# Patient Record
Sex: Female | Born: 2000 | Race: White | Hispanic: No | Marital: Single
Health system: Southern US, Community
[De-identification: ages and names within clinical notes are randomized; demographics above are authoritative.]

---

## 2021-03-18 ENCOUNTER — Ambulatory Visit (INDEPENDENT_AMBULATORY_CARE_PROVIDER_SITE_OTHER): Payer: BLUE CROSS/BLUE SHIELD | Admitting: Sports Medicine

## 2021-03-18 ENCOUNTER — Ambulatory Visit
Admission: RE | Admit: 2021-03-18 | Discharge: 2021-03-18 | Disposition: A | Discharge status: Home / Self Care | Payer: BLUE CROSS/BLUE SHIELD | Source: Ambulatory Visit | Attending: Sports Medicine | Admitting: Sports Medicine

## 2021-03-18 ENCOUNTER — Other Ambulatory Visit: Payer: Self-pay

## 2021-03-18 VITALS — BP 100/72 | Ht 70.0 in | Wt 150.0 lb

## 2021-03-18 DIAGNOSIS — M25551 Pain in right hip: Secondary | ICD-10-CM

## 2021-03-18 NOTE — Progress Notes (Signed)
   Subjective:    Patient ID: Lori Weeks, female    DOB: 2000/07/15, 20 y.o.   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  She is status post left knee ACL reconstructions x2, both performed in 2021.  She has done well postoperatively with those but her right hip pain began shortly after her second reconstruction.  Pain is deep within the groin.  It is intermittent and present with weightbearing.  She describes popping and catching in the groin as well as occasional locking.  She has been undergoing treatment for this in the athletic training room at [facility] but has not noticed any improvement.  She denies any previous hip surgeries.  Pain does not radiate.  She has remained active despite her pain.  Past medical history reviewed Medications reviewed Allergies reviewed    Review of Systems As above    Objective:   Physical Exam  Well-developed, well-nourished.  No acute distress  Right hip: Smooth painless hip range of motion with a negative logroll.  Positive FADIR notable for both pain and popping.  No tenderness to palpation over the greater trochanter.  No tenderness to palpation along the hip flexors.  Decreased strength secondary to pain.  Neurovascularly intact distally.  Standing x-rays of the right hip including AP and lateral views are unremarkable.  I see no obvious loose body and nothing acute.      Assessment & Plan:   Chronic right hip pain worrisome for labral tear versus loose body  MRI arthrogram specifically to rule out a labral tear or a loose body which may need operative intervention.  She has had almost a year of symptoms that have not improved with several weeks of treatment in the training room at [employer].  Phone follow-up with those results when available and we will delineate a more definitive  treatment plan based on those findings.  This note was dictated using Dragon naturally speaking software and may contain errors in syntax, spelling, or content which have not been identified prior to signing this note.

## 2021-04-05 ENCOUNTER — Ambulatory Visit: Payer: BLUE CROSS/BLUE SHIELD | Admitting: Family Medicine

## 2021-04-05 NOTE — Progress Notes (Deleted)
   Lori Weeks is a 20 y.o. female who presents to [facility] today for the following:  Right shoulder pain Patient is a [employer] officer, historic buildings Previously seen in training room earlier in the week for this problem Larey Seat 2 weeks ago onto her anterior right shoulder and has been having pain and difficulty moving it since then Had some chronic right shoulder pain off and on, however this has been more severe since the injury She has not had any improvement in her range of motion since the injury She has been receiving treatment and [name] athletic training room Also currently being evaluated for chronic right hip pain and has an MR arthrogram scheduled for 11/18 ***  PMH reviewed.  ROS as above. Medications reviewed.  Exam:  There were no vitals taken for this visit. Gen: Well NAD MSK:  Right Shoulder: *** Inspection reveals no obvious deformity, atrophy, or asymmetry b/l. No bruising. No swelling Palpation is normal with no TTP over [facility] joint or bicipital groove b/l. Full ROM in flexion, abduction, internal/external rotation b/l NV intact distally b/l Normal scapular function observed b/l Special Tests:  - Impingement: Neg Hawkins, neers, empty can sign. - Supraspinatous: Negative empty can - Infraspinatous/Teres Minor: 5/5 strength with ER - Subscapularis: 5/5 strength with IR - Biceps tendon: Negative Speeds, Yerrgason's  - Labrum: Negative Obriens, negative clunk, good stability - AC Joint: Negative cross arm - Negative apprehension test - No painful arc and no drop arm sign    Assessment and Plan: 1) No problem-specific Assessment & Plan notes found for this encounter.   Luis Abed, D.O.  PGY-4 [facility]  04/05/2021 8:38 AM

## 2021-04-10 ENCOUNTER — Ambulatory Visit (INDEPENDENT_AMBULATORY_CARE_PROVIDER_SITE_OTHER): Payer: BLUE CROSS/BLUE SHIELD | Admitting: Family Medicine

## 2021-04-10 ENCOUNTER — Ambulatory Visit
Admission: RE | Admit: 2021-04-10 | Discharge: 2021-04-10 | Disposition: A | Discharge status: Home / Self Care | Payer: BLUE CROSS/BLUE SHIELD | Source: Ambulatory Visit | Attending: Family Medicine | Admitting: Family Medicine

## 2021-04-10 VITALS — BP 110/62 | Ht 70.0 in | Wt 150.0 lb

## 2021-04-10 DIAGNOSIS — M25511 Pain in right shoulder: Secondary | ICD-10-CM

## 2021-04-10 NOTE — Progress Notes (Signed)
   Lori Weeks is a 20 y.o. 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 the week for this problem Larey Seat 3 weeks ago while playing volleyball, she dove and jammed her shoulder upwards, and has been having pain and difficulty moving it since then Had some chronic right shoulder pain off and on related to [name] sprain, however this has been more severe since the injury and feels different She has not had any improvement in her range of motion since the injury She has been receiving treatment and [name] athletic training room Also currently being evaluated for chronic right hip pain and has an MR arthrogram scheduled for 11/18  PMH reviewed.  ROS as above. Medications reviewed.  Exam:  BP 110/62   Ht 5\' 10"  (1.778 m)   Wt 150 lb (68 kg)   BMI 21.52 kg/m  Gen: Well NAD MSK:  Right Shoulder:  Inspection reveals no obvious deformity, atrophy, or asymmetry b/l. No bruising. No swelling Palpation is normal with no TTP over [name] joint or bicipital groove b/l. Active and passive ROM limited to 90 degrees in flexion, 95 in abduction, L1 in IR, and 60 in ER on right, full on left NV intact distally b/l Normal scapular function observed b/l Special Tests:  - Impingement: Positive Hawkins - Supraspinatous: Positive empty can - Infraspinatous/Teres Minor: 5/5 strength with ER, produces pain - Subscapularis: 5/5 strength with IR, produces pain - Biceps tendon: Negative Speeds - Labrum: Positive Obriens - Positive apprehension test    Assessment and Plan: 1) Acute pain of right shoulder Concern for labral tear vs. Rotator cuff pathology.  Will obtain XR and if negative, will require a MR arthrogram to assess labrum specifically.  Will follow up after imaging with patient to determine treatment plan.  Will likely discuss with [facility] given her significant  symptoms once results return.   Lori Weeks, D.O.  PGY-4 [facility]  04/10/2021 2:16 PM  Addendum:  I was the preceptor for this visit and available for immediate consultation.  04/12/2021 MD Lori Weeks

## 2021-04-10 NOTE — Assessment & Plan Note (Signed)
Concern for labral tear vs. Rotator cuff pathology.  Will obtain XR and if negative, will require a MR arthrogram to assess labrum specifically.  Will follow up after imaging with patient to determine treatment plan.  Will likely discuss with Dr. [name] given her significant symptoms once results return.

## 2021-04-12 ENCOUNTER — Other Ambulatory Visit: Payer: Self-pay

## 2021-04-12 ENCOUNTER — Ambulatory Visit
Admission: RE | Admit: 2021-04-12 | Discharge: 2021-04-12 | Disposition: A | Discharge status: Home / Self Care | Payer: BLUE CROSS/BLUE SHIELD | Source: Ambulatory Visit | Attending: Sports Medicine | Admitting: Sports Medicine

## 2021-04-12 DIAGNOSIS — M25551 Pain in right hip: Secondary | ICD-10-CM

## 2021-04-12 MED ORDER — IOPAMIDOL (ISOVUE-M 200) INJECTION 41%
15.0000 mL | Freq: Once | INTRAMUSCULAR | Status: AC
  Administered 2021-04-12: 15 mL via INTRA_ARTICULAR

## 2021-04-22 ENCOUNTER — Telehealth: Payer: Self-pay | Admitting: Sports Medicine

## 2021-04-22 NOTE — Telephone Encounter (Signed)
  I spoke with Lori Weeks on the phone today after reviewing MRI arthrogram findings of her right hip.  She does have a partial tear of the right anterior superior labrum.  The rest of her hip is unremarkable.  She has had symptoms for several months.  Therefore, I think she will eventually need to see an orthopedist that specializes in hip arthroscopy to discuss appropriate treatment.  However, she also has an MRI arthrogram of her right shoulder in a couple of weeks so I recommend that we first review that study before deciding on further treatment of her hip.  She is in agreement with that plan.  I will be sure to follow-up with her with MRI arthrogram results of her shoulder when available.

## 2021-05-07 ENCOUNTER — Ambulatory Visit
Admission: RE | Admit: 2021-05-07 | Discharge: 2021-05-07 | Disposition: A | Discharge status: Home / Self Care | Payer: BLUE CROSS/BLUE SHIELD | Source: Ambulatory Visit | Attending: Family Medicine | Admitting: Family Medicine

## 2021-05-07 ENCOUNTER — Other Ambulatory Visit: Payer: Self-pay

## 2021-05-07 DIAGNOSIS — M25511 Pain in right shoulder: Secondary | ICD-10-CM

## 2021-05-07 MED ORDER — IOPAMIDOL (ISOVUE-M 300) INJECTION 61%
10.0000 mL | Freq: Once | INTRAMUSCULAR | Status: AC | PRN
  Administered 2021-05-07: 10 mL via INTRA_ARTICULAR

## 2021-05-08 ENCOUNTER — Telehealth: Payer: Self-pay | Admitting: Family Medicine

## 2021-05-08 NOTE — Telephone Encounter (Signed)
Shoulder arthrogram reviewed, shows posterior labral tear.  Discussed with patient that she will likely need surgical consultation.  Given that she also has a hip labral tear, and prior notes with Dr. Margaretha Sheffield state that he was planning ot see results of shoulder MRA before placing referral, will discuss with him where referral will be placed.  Likely shoulder will be referred to Dr. [name] at [facility].  Told patient that we will call back tomorrow.

## 2021-05-09 ENCOUNTER — Telehealth: Payer: Self-pay | Admitting: Family Medicine

## 2021-05-09 NOTE — Telephone Encounter (Signed)
Called patient after discussing with Dr. Margaretha Sheffield.  Will refer to Dr. [name] for shoulder eval and consider referral for hip pending decision for shoulder surgery.  She also reports she may go home for surgery, but would like to see Dr. [name] first.  Advised to obtain images from [facility].

## 2022-12-03 IMAGING — MR MR SHOULDER*R* W/CM
6 series · 40 of 40 positions shown · IV contrast (agent unspecified)
Comparison: X-ray shoulder 04/10/2021.

CLINICAL DATA: Right shoulder pain with weakness for 2 months.
years ago. Clinical concern for labral tear.

EXAM:
MR ARTHROGRAM OF THE RIGHT SHOULDER
TECHNIQUE: Multiplanar, multisequence MR imaging of the right shoulder was
performed following the administration of intra-articular contrast.
CONTRAST:  See Injection Documentation.

[Series 3: T1 fat-sat · axial · 4.0mm · 0.29mm/px · z∈[-14,+95]mm · 6 of 23 slices shown (1 of 3)]
[im 1/23]
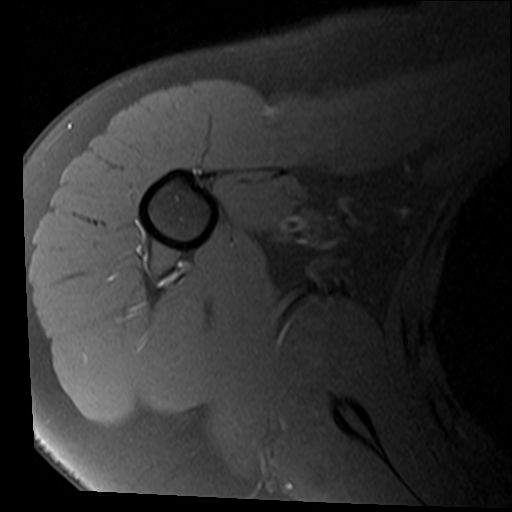
[im 5/23]
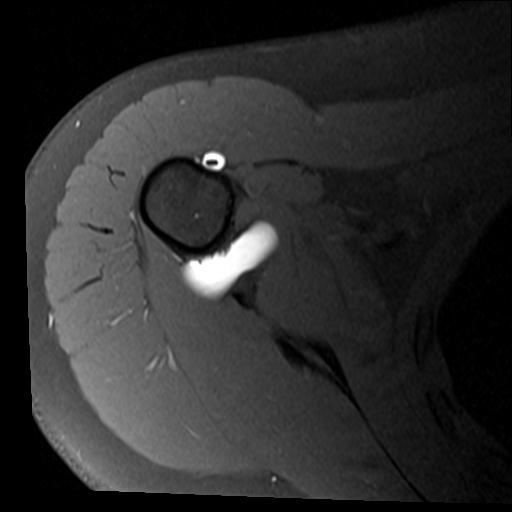
[im 9/23]
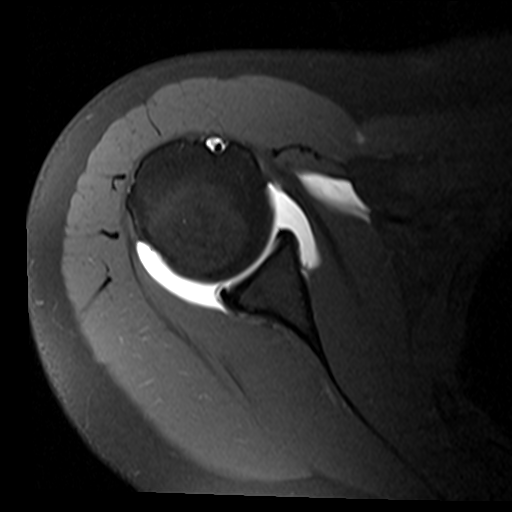
[im 14/23]
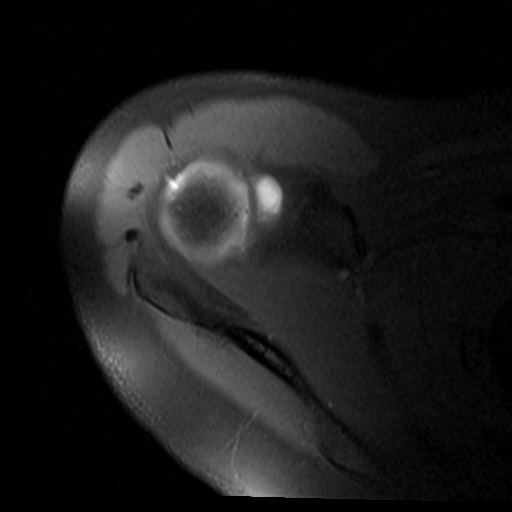
[im 18/23]
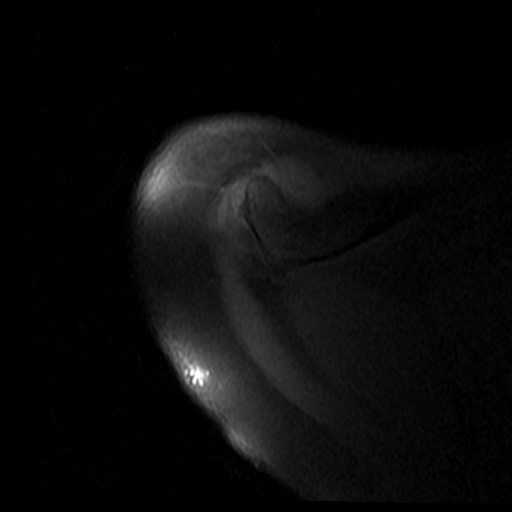
[im 23/23]
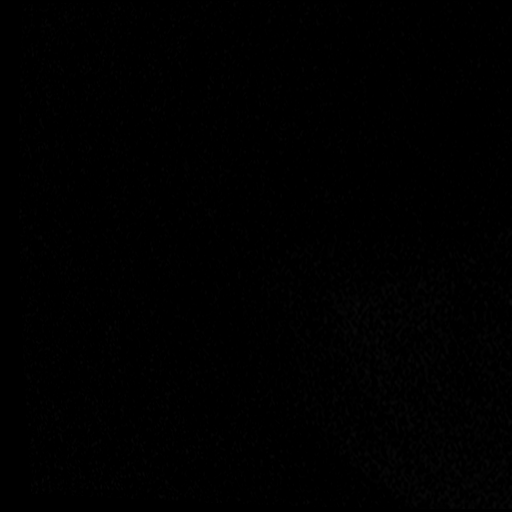

[Series 4: T2 fat-sat · oblique · 4.0mm · 0.59mm/px · 6 of 24 slices shown (1 of 2)]
[im 1/24]
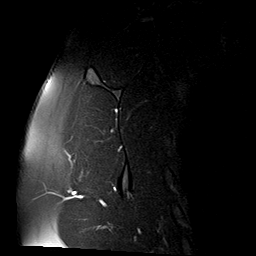
[im 5/24]
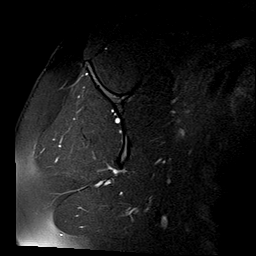
[im 10/24]
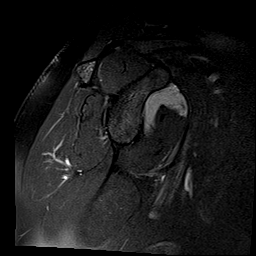
[im 14/24]
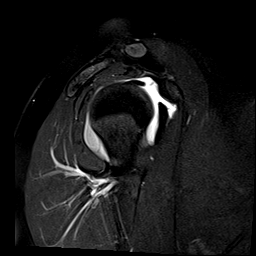
[im 19/24]
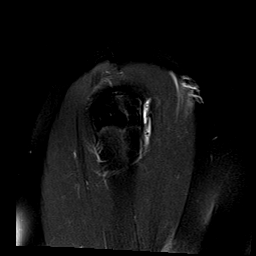
[im 24/24]
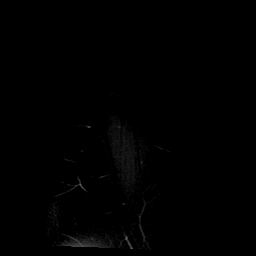

[Series 5: T1 · oblique · 4.0mm · 0.59mm/px · 7 of 22 slices shown]
[im 1/22]
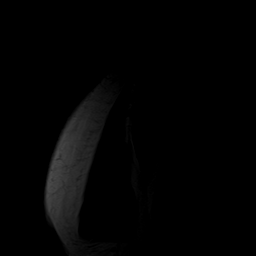
[im 4/22]
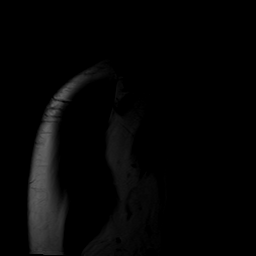
[im 8/22]
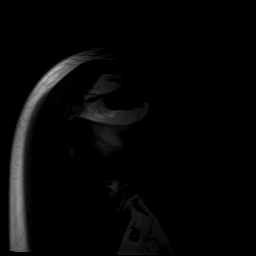
[im 11/22]
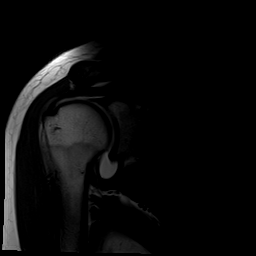
[im 15/22]
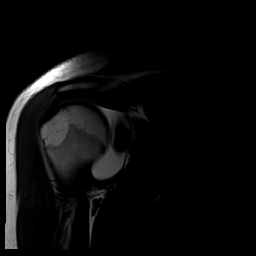
[im 18/22]
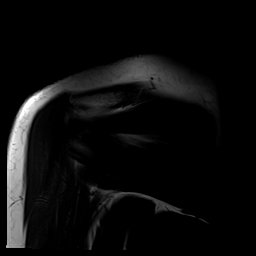
[im 22/22]
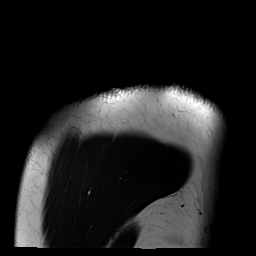

[Series 6: T1 fat-sat · oblique · 4.0mm · 0.59mm/px · 7 of 22 slices shown (2 of 3)]
[im 1/22]
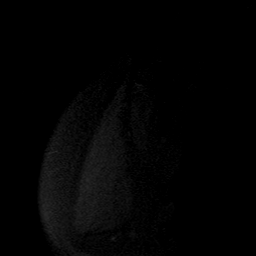
[im 4/22]
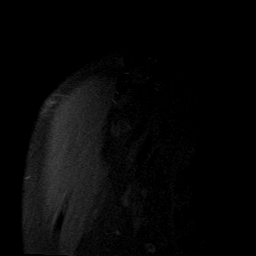
[im 8/22]
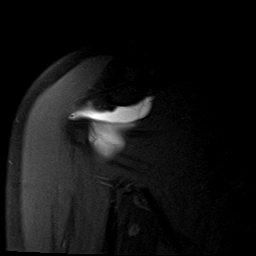
[im 11/22]
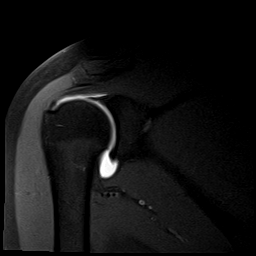
[im 15/22]
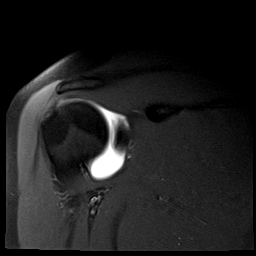
[im 18/22]
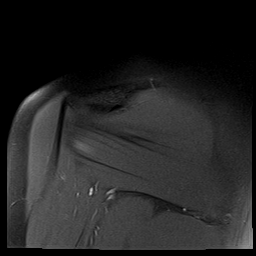
[im 22/22]
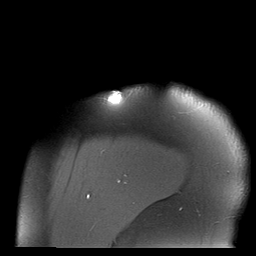

[Series 7: T2 fat-sat · oblique · 4.0mm · 0.59mm/px · 7 of 22 slices shown (2 of 2)]
[im 1/22]
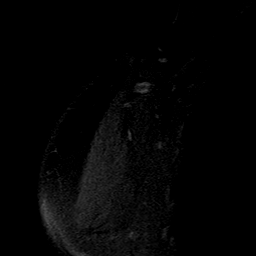
[im 4/22]
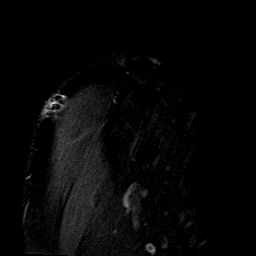
[im 8/22]
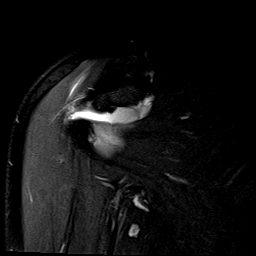
[im 11/22]
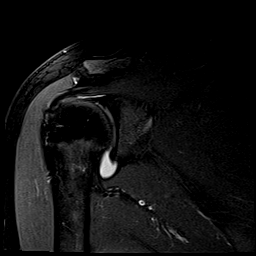
[im 15/22]
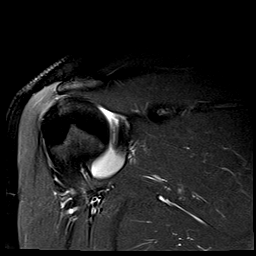
[im 18/22]
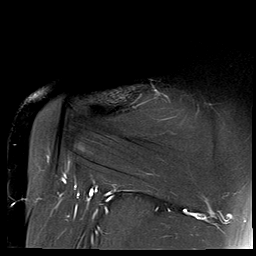
[im 22/22]
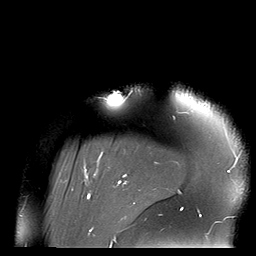

[Series 11: T1 fat-sat · sagittal · 4.0mm · 0.66mm/px · 7 of 22 slices shown (3 of 3)]
[im 1/22]
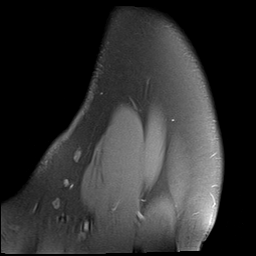
[im 4/22]
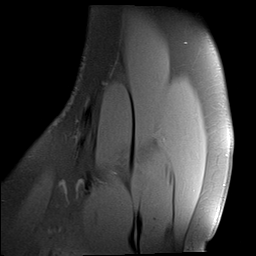
[im 8/22]
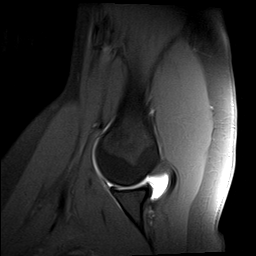
[im 11/22]
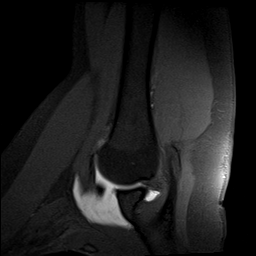
[im 15/22]
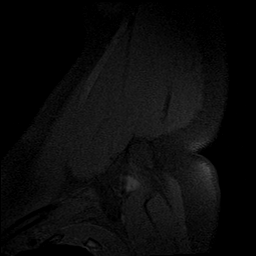
[im 18/22]
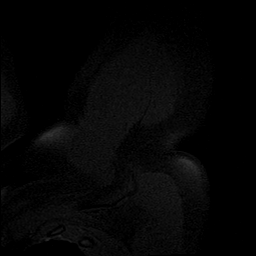
[im 22/22]
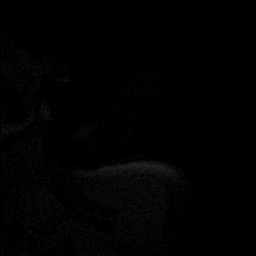

[40 of 40 positions shown; findings below may reference images not displayed]

FINDINGS: Rotator cuff: Focal low-grade articular-sided tear involving the
anterior aspect of the distal infraspinatus tendon involving
approximately 25% of the tendon depth (series 6, image 9). Low-grade
bursal-sided fraying is also present at this location. No
full-thickness or retracted tear. Supraspinatus, subscapularis, and
teres minor tendons are intact.

Muscles: Preserved bulk and signal intensity of the rotator cuff
musculature without edema, atrophy, or fatty infiltration.

Biceps long head:  Intact and appropriately positioned.

Acromioclavicular Joint: No significant degenerative changes of the
AC joint. AC ligaments intact. No pericapsular edema. No
subacromial-subdeltoid bursal fluid.

Glenohumeral Joint: Adequately distended with injected contrast. No
cartilage defect.

Labrum: Partially detached tear of the free edge of the posterior
labrum involving approximately the [DATE] to [DATE] positions of the
labrum (series 3, images 11-15). Labrum is otherwise intact. No
paralabral cyst.

Bones: No acute fracture. No dislocation. No bone marrow edema. No
marrow replacing bone lesion.

Other: None.
IMPRESSION: 1. Partially detached tear of the free edge of the posterior labrum
involving approximately the [DATE] to [DATE] positions.
2. Focal low-grade articular sided tear of the distal infraspinatus
tendon involving approximately 25% of the tendon depth. Low-grade
bursal sided fraying is also present at this location. No
full-thickness or retracted tear.
3. No evidence of AC joint injury.
